# Patient Record
Sex: Male | Born: 1995 | Hispanic: No | Marital: Single | Smoking: Never smoker
Health system: Southern US, Community
[De-identification: ages and names within clinical notes are randomized; demographics above are authoritative.]

---
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
## 2015-02-19 IMAGING — CR DG RIBS W/ CHEST 3+V*R*
3 series · 3 of 3 positions shown · non-contrast
Comparison: None.

CLINICAL DATA: Injury.  Right chest wall pain.

EXAM:
RIGHT RIBS AND CHEST - 3+ VIEW

[w chest pa]
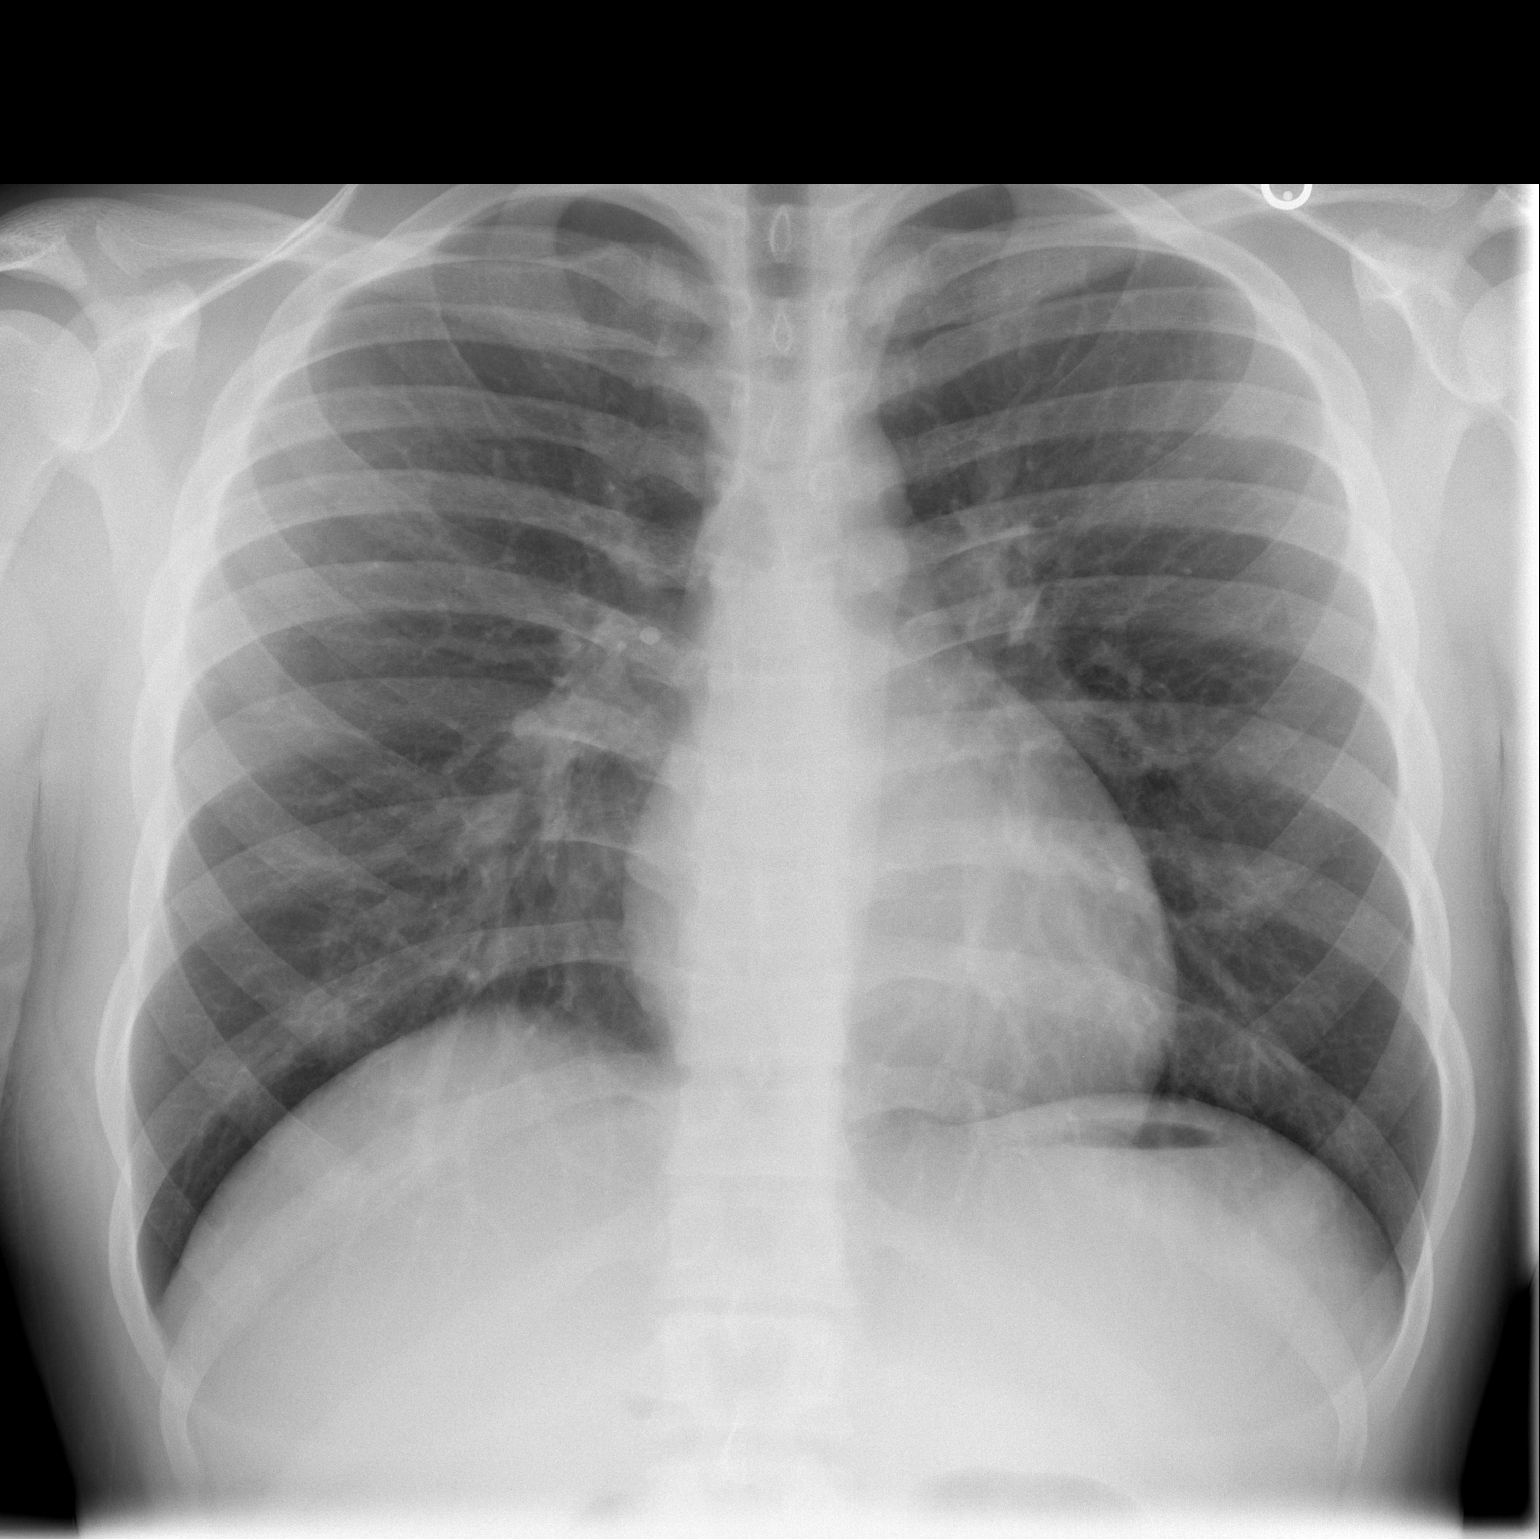

[w ribs ap/pa upper right]
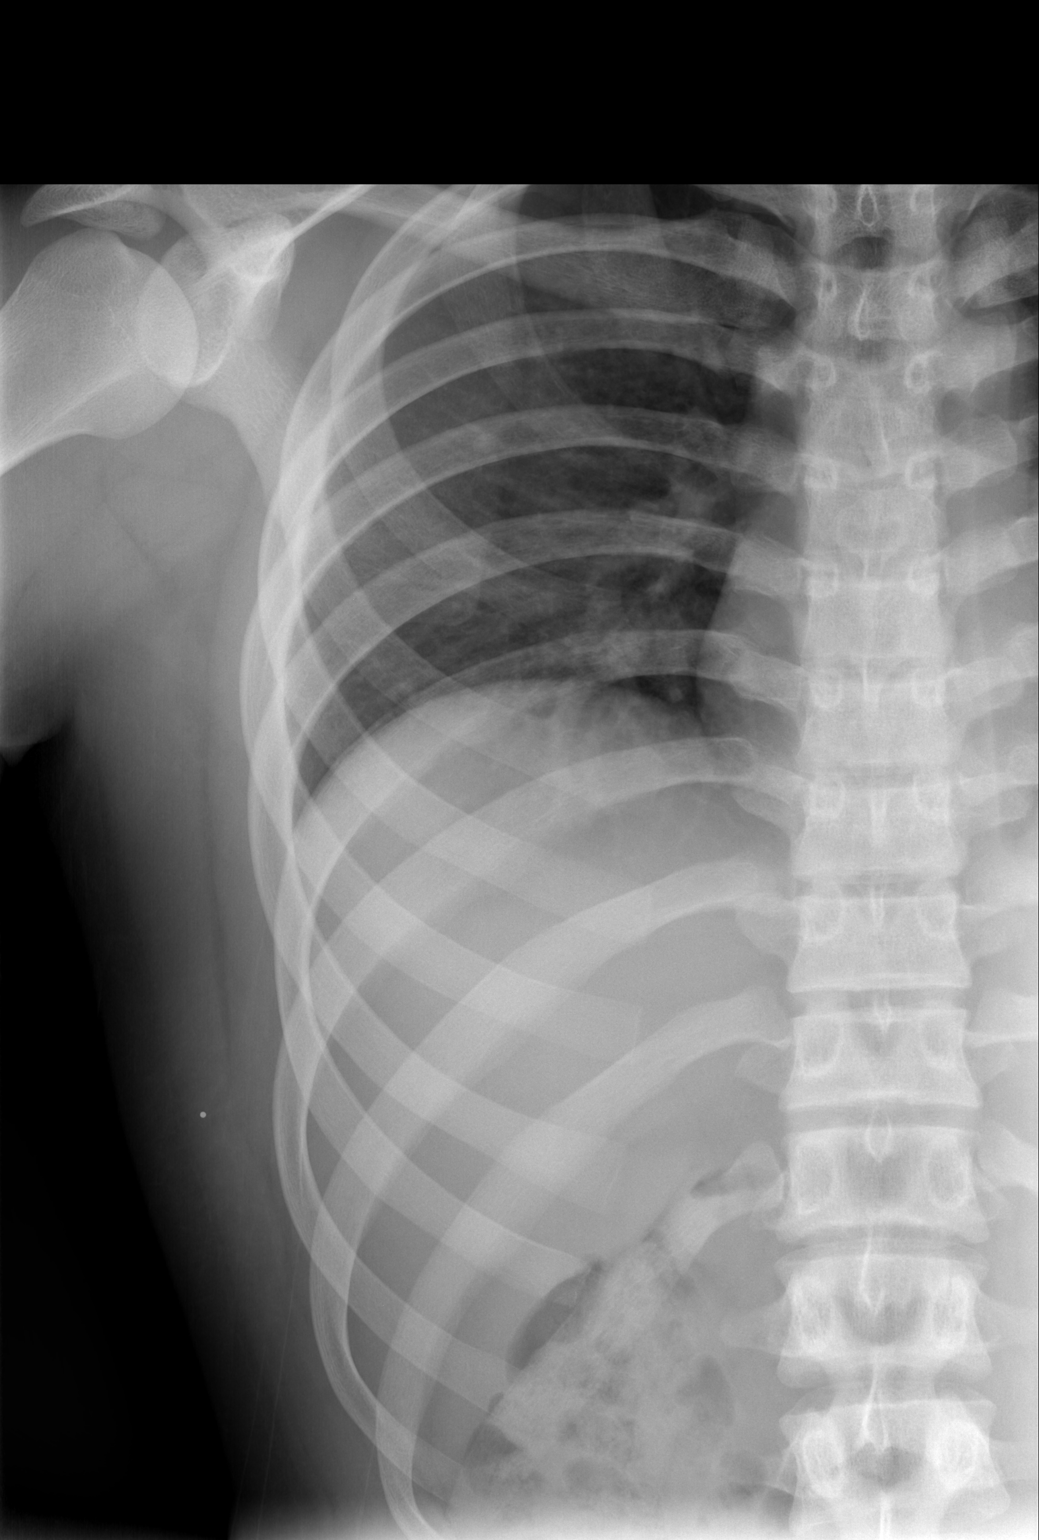

[w ribs oblique right]
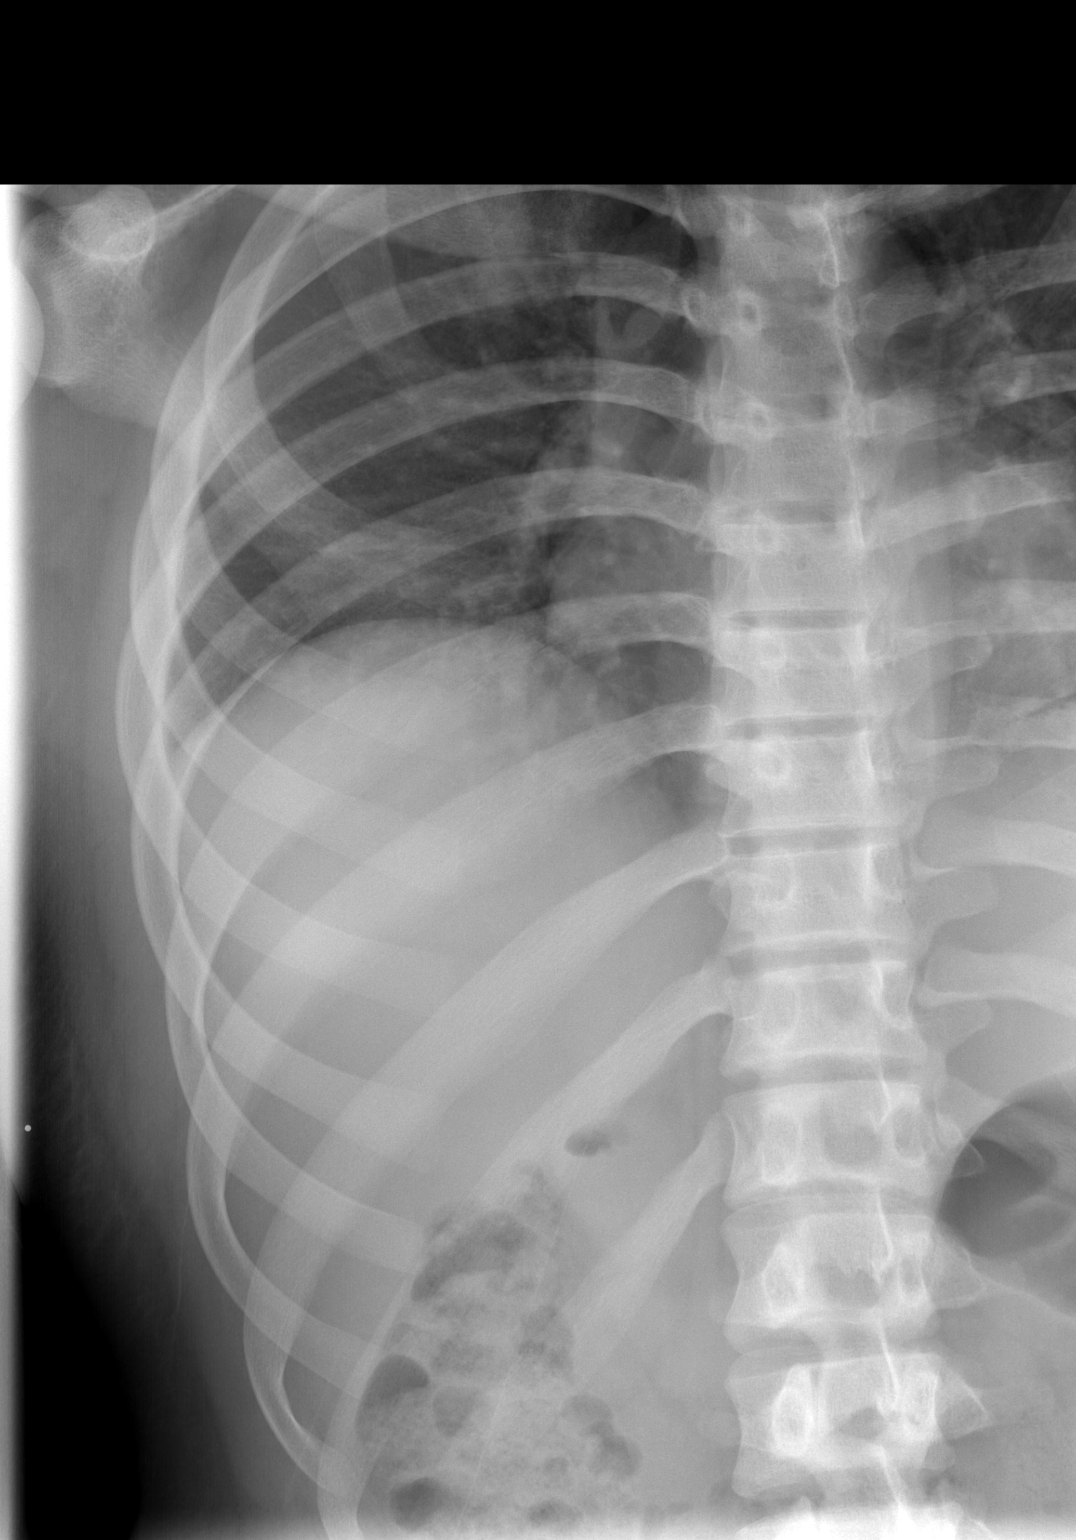

[3 of 3 positions shown; findings below may reference images not displayed]

FINDINGS: No fracture or other bone lesions are seen involving the ribs. There
is no evidence of pneumothorax or pleural effusion. Both lungs are
clear. Heart size and mediastinal contours are within normal limits.
IMPRESSION: Negative.
